# Patient Record
Sex: Female | Born: 1945 | Race: White | Hispanic: No | Marital: Married | Smoking: Never smoker
Health system: Southern US, Community
[De-identification: ages and names within clinical notes are randomized; demographics above are authoritative.]

## PROBLEM LIST (undated history)

## (undated) DIAGNOSIS — Z9889 Other specified postprocedural states: Secondary | ICD-10-CM

## (undated) DIAGNOSIS — E7800 Pure hypercholesterolemia, unspecified: Secondary | ICD-10-CM

## (undated) DIAGNOSIS — R112 Nausea with vomiting, unspecified: Secondary | ICD-10-CM

## (undated) DIAGNOSIS — I10 Essential (primary) hypertension: Secondary | ICD-10-CM

## (undated) HISTORY — PX: OTHER SURGICAL HISTORY: SHX169

## (undated) HISTORY — DX: Pure hypercholesterolemia, unspecified: E78.00

## (undated) HISTORY — PX: NASAL SEPTUM SURGERY: SHX37

## (undated) HISTORY — DX: Essential (primary) hypertension: I10

---

## 2016-06-20 ENCOUNTER — Encounter (INDEPENDENT_AMBULATORY_CARE_PROVIDER_SITE_OTHER): Payer: Self-pay | Admitting: Internal Medicine

## 2016-06-29 ENCOUNTER — Other Ambulatory Visit (INDEPENDENT_AMBULATORY_CARE_PROVIDER_SITE_OTHER): Payer: Self-pay | Admitting: Internal Medicine

## 2016-06-29 ENCOUNTER — Encounter (INDEPENDENT_AMBULATORY_CARE_PROVIDER_SITE_OTHER): Payer: Self-pay | Admitting: Internal Medicine

## 2016-06-29 ENCOUNTER — Encounter (INDEPENDENT_AMBULATORY_CARE_PROVIDER_SITE_OTHER): Payer: Self-pay

## 2016-06-29 ENCOUNTER — Telehealth (INDEPENDENT_AMBULATORY_CARE_PROVIDER_SITE_OTHER): Payer: Self-pay | Admitting: *Deleted

## 2016-06-29 ENCOUNTER — Encounter (INDEPENDENT_AMBULATORY_CARE_PROVIDER_SITE_OTHER): Payer: Self-pay | Admitting: *Deleted

## 2016-06-29 ENCOUNTER — Ambulatory Visit (INDEPENDENT_AMBULATORY_CARE_PROVIDER_SITE_OTHER): Payer: Medicare Other | Admitting: Internal Medicine

## 2016-06-29 VITALS — BP 126/62 | HR 80 | Temp 97.6°F | Ht 64.5 in | Wt 191.9 lb

## 2016-06-29 DIAGNOSIS — I10 Essential (primary) hypertension: Secondary | ICD-10-CM

## 2016-06-29 DIAGNOSIS — E7800 Pure hypercholesterolemia, unspecified: Secondary | ICD-10-CM

## 2016-06-29 DIAGNOSIS — R195 Other fecal abnormalities: Secondary | ICD-10-CM

## 2016-06-29 DIAGNOSIS — K625 Hemorrhage of anus and rectum: Secondary | ICD-10-CM | POA: Insufficient documentation

## 2016-06-29 NOTE — Patient Instructions (Signed)
Colonoscopy.  The risks and benefits such as perforation, bleeding, and infection were reviewed with the patient and is agreeable. 

## 2016-06-29 NOTE — Telephone Encounter (Signed)
Patient needs trilyte 

## 2016-06-29 NOTE — Progress Notes (Signed)
   Subjective:    Patient ID: April Coleman, female    DOB: 1946-06-13, 70 y.o.   MRN: [id number]  HPI Referred by Dr. Trudi Ida. She tells me in the last 5-6 weeks her BMs have changed. Stools are more like peanut butter. She may have to go 2-3 times a day. She says she has lower abdominal pain with her BM. She has seen some blood in her stool yesterday (small amt). She see blood about every 2-3 times a week. Her appetite is good. No weight loss. No dysphagia. Zantac controls her GERD. No melena.  Grandfather had colon cancer age 79.    06/15/2016 H and H 16.5 and 47.1, MCV 90.4, total protein 7.4, Albumin 3.8, total bili 0.9, direct bili 0.2, indirect bili 0.7, AST 45, ALT 70, ALP 72.   01/15/2003 Colonosocpy: Screening Dr. [place] Carbo: Mild internal hemorrhoids, otherwise normal.   Review of Systems Past Medical History:  Diagnosis Date  . High blood pressure   . High cholesterol     Past Surgical History:  Procedure Laterality Date  . breast biopsies     benign  . complete hysterectomy     vaginal bleeding, cramps  . NASAL SEPTUM SURGERY      Allergies  Allergen Reactions  . Codeine     nauseated  . Morphine And Related     nauseated    No current outpatient prescriptions on file prior to visit.   No current facility-administered medications on file prior to visit.    Current Outpatient Prescriptions  Medication Sig Dispense Refill  . atenolol (TENORMIN) 50 MG tablet Take 50 mg by mouth daily.    . cholecalciferol (VITAMIN D) 1000 units tablet Take 1,000 Units by mouth daily.    . hydrochlorothiazide (HYDRODIURIL) 25 MG tablet Take 25 mg by mouth daily.    . montelukast (SINGULAIR) 10 MG tablet Take 10 mg by mouth at bedtime.    . Multiple Vitamin (MULTIVITAMIN) tablet Take 1 tablet by mouth daily.    . naproxen sodium (ANAPROX) 220 MG tablet Take 220 mg by mouth 2 (two) times daily with a meal.    . pantoprazole (PROTONIX) 20 MG tablet Take 20 mg by mouth  daily.    . pravastatin (PRAVACHOL) 40 MG tablet Take 40 mg by mouth daily.    . ranitidine (ZANTAC) 150 MG capsule Take 150 mg by mouth every evening.    . vitamin B-12 (CYANOCOBALAMIN) 1000 MCG tablet Take 1,000 mcg by mouth daily.     No current facility-administered medications for this visit.         Objective:   Physical Exam Blood pressure 126/62, pulse 80, temperature 97.6 F (36.4 C), height 5' 4.5" (1.638 m), weight 191 lb 14.4 oz (87 kg). Alert and oriented. Skin warm and dry. Oral mucosa is moist.   . Sclera anicteric, conjunctivae is pink. Thyroid not enlarged. No cervical lymphadenopathy. Lungs clear. Heart regular rate and rhythm.  Abdomen is soft. Bowel sounds are positive. No hepatomegaly. No abdominal masses felt. No tenderness.  No edema to lower extremities.  Stool brown and guaiac negative.         Assessment & Plan:  Change in stool. Colonic neoplasm needs to be ruled out. Colonoscopy. The risks and benefits such as perforation, bleeding, and infection were reviewed with the patient and is agreeable.

## 2016-07-02 MED ORDER — PEG 3350-KCL-NA BICARB-NACL 420 G PO SOLR: 4000.0000 mL | Freq: Once | ORAL | 0 refills | Status: AC

## 2016-09-12 ENCOUNTER — Encounter (HOSPITAL_COMMUNITY): Payer: Self-pay

## 2016-09-12 ENCOUNTER — Encounter (HOSPITAL_COMMUNITY)
Admission: RE | Disposition: A | Discharge status: Home / Self Care | Payer: Self-pay | Source: Ambulatory Visit | Attending: Internal Medicine

## 2016-09-12 ENCOUNTER — Ambulatory Visit (HOSPITAL_COMMUNITY)
Admission: RE | Admit: 2016-09-12 | Discharge: 2016-09-12 | Disposition: A | Discharge status: Home / Self Care | Payer: Medicare Other | Source: Ambulatory Visit | Attending: Internal Medicine | Admitting: Internal Medicine

## 2016-09-12 DIAGNOSIS — K921 Melena: Secondary | ICD-10-CM | POA: Insufficient documentation

## 2016-09-12 DIAGNOSIS — K644 Residual hemorrhoidal skin tags: Secondary | ICD-10-CM

## 2016-09-12 DIAGNOSIS — Z79899 Other long term (current) drug therapy: Secondary | ICD-10-CM | POA: Insufficient documentation

## 2016-09-12 DIAGNOSIS — K625 Hemorrhage of anus and rectum: Secondary | ICD-10-CM

## 2016-09-12 DIAGNOSIS — R194 Change in bowel habit: Secondary | ICD-10-CM | POA: Insufficient documentation

## 2016-09-12 DIAGNOSIS — K5730 Diverticulosis of large intestine without perforation or abscess without bleeding: Secondary | ICD-10-CM | POA: Insufficient documentation

## 2016-09-12 DIAGNOSIS — D123 Benign neoplasm of transverse colon: Secondary | ICD-10-CM | POA: Diagnosis not present

## 2016-09-12 DIAGNOSIS — R195 Other fecal abnormalities: Secondary | ICD-10-CM

## 2016-09-12 HISTORY — PX: POLYPECTOMY: SHX5525

## 2016-09-12 HISTORY — DX: Nausea with vomiting, unspecified: R11.2

## 2016-09-12 HISTORY — DX: Nausea with vomiting, unspecified: Z98.890

## 2016-09-12 HISTORY — PX: COLONOSCOPY: SHX5424

## 2016-09-12 SURGERY — COLONOSCOPY
Anesthesia: Moderate Sedation

## 2016-09-12 MED ORDER — MEPERIDINE HCL 50 MG/ML IJ SOLN
INTRAMUSCULAR | Status: AC
  Filled 2016-09-12: qty 1

## 2016-09-12 MED ORDER — SODIUM CHLORIDE 0.9 % IV SOLN
INTRAVENOUS | Status: DC
  Administered 2016-09-12: 13:00:00 via INTRAVENOUS

## 2016-09-12 MED ORDER — MIDAZOLAM HCL 5 MG/5ML IJ SOLN
INTRAMUSCULAR | Status: AC
  Filled 2016-09-12: qty 10

## 2016-09-12 MED ORDER — MEPERIDINE HCL 50 MG/ML IJ SOLN
INTRAMUSCULAR | Status: DC | PRN
  Administered 2016-09-12 (×2): 25 mg via INTRAVENOUS

## 2016-09-12 MED ORDER — MIDAZOLAM HCL 5 MG/5ML IJ SOLN
INTRAMUSCULAR | Status: DC | PRN
  Administered 2016-09-12: 2 mg via INTRAVENOUS
  Administered 2016-09-12: 1 mg via INTRAVENOUS
  Administered 2016-09-12: 2 mg via INTRAVENOUS
  Administered 2016-09-12: 1 mg via INTRAVENOUS
  Administered 2016-09-12: 2 mg via INTRAVENOUS

## 2016-09-12 MED ORDER — SIMETHICONE 40 MG/0.6ML PO SUSP
ORAL | Status: DC | PRN
  Administered 2016-09-12: 14:00:00

## 2016-09-12 MED ORDER — PSYLLIUM 28 % PO PACK: 1.0000 | PACK | Freq: Every day | ORAL | Status: AC

## 2016-09-12 NOTE — H&P (Signed)
April Coleman is an 71 y.o. female.   Chief Complaint: Patient is here for colonoscopy. HPI: Patient is 70 year old Caucasian female whose last colonoscopy was 13 years ago who presents with 4 month history of change in her bowel habits. She has noted increased frequency and stool is not formed anymore. She's had a few episodes of hematochezia felt to be secondary to hemorrhoids. She has good appetite. She has not changed her medications recently. Family history is negative for CRC.  Past Medical History:  Diagnosis Date  . High blood pressure   . High cholesterol   . PONV (postoperative nausea and vomiting)     Past Surgical History:  Procedure Laterality Date  . breast biopsies     benign  . complete hysterectomy     vaginal bleeding, cramps  . NASAL SEPTUM SURGERY      Family History  Problem Relation Age of Onset  . Colon cancer Neg Hx    Social History:  reports that she has never smoked. She has never used smokeless tobacco. She reports that she drinks alcohol. She reports that she does not use drugs.  Allergies:  Allergies  Allergen Reactions  . Codeine     nauseated  . Morphine And Related     nauseated    Medications Prior to Admission  Medication Sig Dispense Refill  . atenolol (TENORMIN) 50 MG tablet Take 50 mg by mouth daily.    . cholecalciferol (VITAMIN D) 1000 units tablet Take 1,000 Units by mouth daily.    . hydrochlorothiazide (HYDRODIURIL) 25 MG tablet Take 25 mg by mouth daily.    . montelukast (SINGULAIR) 10 MG tablet Take 10 mg by mouth at bedtime.    . Multiple Vitamin (MULTIVITAMIN) tablet Take 1 tablet by mouth daily.    . naproxen sodium (ANAPROX) 220 MG tablet Take 220 mg by mouth daily as needed (pain).     . pantoprazole (PROTONIX) 20 MG tablet Take 20 mg by mouth daily.    . pravastatin (PRAVACHOL) 40 MG tablet Take 40 mg by mouth daily.    . ranitidine (ZANTAC) 150 MG capsule Take 150 mg by mouth every evening.    . vitamin B-12  (CYANOCOBALAMIN) 1000 MCG tablet Take 1,000 mcg by mouth daily.      No results found for this or any previous visit (from the past 48 hour(s)). No results found.  ROS  Blood pressure (!) 144/83, pulse 74, temperature 98.7 F (37.1 C), temperature source Oral, resp. rate 18, height 5\' 4"  (1.626 m), weight 185 lb (83.9 kg), SpO2 96 %. Physical Exam  Constitutional: She appears well-developed and well-nourished.  HENT:  Mouth/Throat: Oropharynx is clear and moist.  Eyes: Conjunctivae are normal. No scleral icterus.  Neck: No thyromegaly present.  Cardiovascular: Normal rate and regular rhythm.   Murmur heard. Faint SEM at upper LSB  Respiratory: Effort normal and breath sounds normal.  GI: Soft. She exhibits no distension and no mass.  Musculoskeletal: She exhibits no edema.  Lymphadenopathy:    She has no cervical adenopathy.  Neurological: She is alert.  Skin: Skin is warm and dry.     Assessment/Plan Change in bowel habits and hematochezia. Diagnostic colonoscopy.  Hildred Laser, MD 09/12/2016, 2:09 PM

## 2016-09-12 NOTE — Op Note (Signed)
[facility] Patient Name: April Coleman Procedure Date: 09/12/2016 1:50 PM MRN: [id number] Date of Birth: 1946-02-05 Attending MD: Hildred Laser , MD CSN: [id number] Age: 70 Admit Type: Outpatient Procedure:                Colonoscopy Indications:              Hematochezia, Change in bowel habits Providers:                Hildred Laser, MD, Otis Peak B. Sharon Seller, RN, Randa Spike, Technician Referring MD:             Rachel Bo. Harris, MD Medicines:                Meperidine 50 mg IV, Midazolam 8 mg IV Complications:            No immediate complications. Estimated Blood Loss:     Estimated blood loss: none. Estimated blood loss                            was minimal. Procedure:                Pre-Anesthesia Assessment:                           - Prior to the procedure, a History and Physical                            was performed, and patient medications and                            allergies were reviewed. The patient's tolerance of                            previous anesthesia was also reviewed. The risks                            and benefits of the procedure and the sedation                            options and risks were discussed with the patient.                            All questions were answered, and informed consent                            was obtained. Prior Anticoagulants: The patient                            last took naproxen 7 days prior to the procedure.                            ASA Grade Assessment: II - A patient with mild  systemic disease. After reviewing the risks and                            benefits, the patient was deemed in satisfactory                            condition to undergo the procedure.                           After obtaining informed consent, the colonoscope                            was passed under direct vision. Throughout the                             procedure, the patient's blood pressure, pulse, and                            oxygen saturations were monitored continuously. The                            [id number]) scope was introduced through                            the anus and advanced to the the cecum, identified                            by appendiceal orifice and ileocecal valve. The                            colonoscopy was performed without difficulty. The                            patient tolerated the procedure well. The quality                            of the bowel preparation was adequate. The                            ileocecal valve, appendiceal orifice, and rectum                            were photographed. Scope In: 2:19:32 PM Scope Out: 2:38:22 PM Scope Withdrawal Time: 0 hours 11 minutes 40 seconds  Total Procedure Duration: 0 hours 18 minutes 50 seconds  Findings:      Two sessile polyps were found in the proximal transverse colon. The       polyps were small in size. These were biopsied with a cold forceps for       histology. The pathology specimen was placed into Bottle Number 1.      A few small-mouthed diverticula were found in the sigmoid colon. Single       diverticulum at cecum.      External hemorrhoids were found during retroflexion. The hemorrhoids       were small. Impression:               -  Two small polyps in the proximal transverse                            colon. Biopsied.                           - Diverticulosis in the sigmoid colon alongwith one                            diverticulum at cecum.                           - External hemorrhoids. Moderate Sedation:      Moderate (conscious) sedation was administered by the endoscopy nurse       and supervised by the endoscopist. The following parameters were       monitored: oxygen saturation, heart rate, blood pressure, CO2       capnography and response to care. Total physician intraservice time was       24  minutes. Recommendation:           - Patient has a contact number available for                            emergencies. The signs and symptoms of potential                            delayed complications were discussed with the                            patient. Return to normal activities tomorrow.                            Written discharge instructions were provided to the                            patient.                           - High fiber diet today.                           - Continue present medications.                           - Use original regular Metamucil one tablespoon PO                            daily.                           - Await pathology results.                           - Repeat colonoscopy for surveillance based on                            pathology results. Procedure Code(s):        ---  Professional ---                           [phone], Colonoscopy, flexible; with biopsy, single                            or multiple                           99152, Moderate sedation services provided by the                            same physician or other qualified health care                            professional performing the diagnostic or                            therapeutic service that the sedation supports,                            requiring the presence of an independent trained                            observer to assist in the monitoring of the                            patient's level of consciousness and physiological                            status; initial 15 minutes of intraservice time,                            patient age 55 years or older                           [phone], Moderate sedation services; each additional                            15 minutes intraservice time Diagnosis Code(s):        --- Professional ---                           D12.3, Benign neoplasm of transverse colon (hepatic                            flexure or  splenic flexure)                           K64.4, Residual hemorrhoidal skin tags                           K92.1, Melena (includes Hematochezia)                           R19.4, Change in bowel habit  K57.30, Diverticulosis of large intestine without                            perforation or abscess without bleeding CPT copyright 2016 American Medical Association. All rights reserved. The codes documented in this report are preliminary and upon coder review may  be revised to meet current compliance requirements. Hildred Laser, MD Hildred Laser, MD 09/12/2016 2:48:35 PM This report has been signed electronically. Number of Addenda: 0

## 2016-09-12 NOTE — Discharge Instructions (Signed)
Resume usual medications and high fiber diet. Metamucil one heaping tablespoon full or 4 g by mouth daily at bedtime. No driving for 24 hours. Physician will call with biopsy results.    Colonoscopy, Adult, Care After This sheet gives you information about how to care for yourself after your procedure. Your health care provider may also give you more specific instructions. If you have problems or questions, contact your health care provider. What can I expect after the procedure? After the procedure, it is common to have:  A small amount of blood in your stool for 24 hours after the procedure.  Some gas.  Mild abdominal cramping or bloating. Follow these instructions at home: General instructions  For the first 24 hours after the procedure:  Do not drive or use machinery.  Do not sign important documents.  Do not drink alcohol.  Do your regular daily activities at a slower pace than normal.  Eat soft, easy-to-digest foods.  Rest often.  Take over-the-counter or prescription medicines only as told by your health care provider.  It is up to you to get the results of your procedure. Ask your health care provider, or the department performing the procedure, when your results will be ready. Relieving cramping and bloating  Try walking around when you have cramps or feel bloated.  Apply heat to your abdomen as told by your health care provider. Use a heat source that your health care provider recommends, such as a moist heat pack or a heating pad.  Place a towel between your skin and the heat source.  Leave the heat on for 20-30 minutes.  Remove the heat if your skin turns bright red. This is especially important if you are unable to feel pain, heat, or cold. You may have a greater risk of getting burned. Eating and drinking  Drink enough fluid to keep your urine clear or pale yellow.  Resume your normal diet as instructed by your health care provider. Avoid heavy or  fried foods that are hard to digest.  Avoid drinking alcohol for as long as instructed by your health care provider. Contact a health care provider if:  You have blood in your stool 2-3 days after the procedure. Get help right away if:  You have more than a small spotting of blood in your stool.  You pass large blood clots in your stool.  Your abdomen is swollen.  You have nausea or vomiting.  You have a fever.  You have increasing abdominal pain that is not relieved with medicine. This information is not intended to replace advice given to you by your health care provider. Make sure you discuss any questions you have with your health care provider. Document Released: 05/08/2004 Document Revised: 06/18/2016 Document Reviewed: 12/06/2015 Elsevier Interactive Patient Education  2017 [employer].   Colon Polyps Introduction Polyps are tissue growths inside the body. Polyps can grow in many places, including the large intestine (colon). A polyp may be a round bump or a mushroom-shaped growth. You could have one polyp or several. Most colon polyps are noncancerous (benign). However, some colon polyps can become cancerous over time. What are the causes? The exact cause of colon polyps is not known. What increases the risk? This condition is more likely to develop in people who:  Have a family history of colon cancer or colon polyps.  Are older than 24 or older than 45 if they are African American.  Have inflammatory bowel disease, such as ulcerative colitis or  Crohn disease.  Are overweight.  Smoke cigarettes.  Do not get enough exercise.  Drink too much alcohol.  Eat a diet that is:  High in fat and red meat.  Low in fiber.  Had childhood cancer that was treated with abdominal radiation. What are the signs or symptoms? Most polyps do not cause symptoms. If you have symptoms, they may include:  Blood coming from your rectum when having a bowel movement.  Blood  in your stool.The stool may look dark red or black.  A change in bowel habits, such as constipation or diarrhea. How is this diagnosed? This condition is diagnosed with a colonoscopy. This is a procedure that uses a lighted, flexible scope to look at the inside of your colon. How is this treated? Treatment for this condition involves removing any polyps that are found. Those polyps will then be tested for cancer. If cancer is found, your health care provider will talk to you about options for colon cancer treatment. Follow these instructions at home: Diet  Eat plenty of fiber, such as fruits, vegetables, and whole grains.  Eat foods that are high in calcium and vitamin D, such as milk, cheese, yogurt, eggs, liver, fish, and broccoli.  Limit foods high in fat, red meats, and processed meats, such as hot dogs, sausage, bacon, and lunch meats.  Maintain a healthy weight, or lose weight if recommended by your health care provider. General instructions  Do not smoke cigarettes.  Do not drink alcohol excessively.  Keep all follow-up visits as told by your health care provider. This is important. This includes keeping regularly scheduled colonoscopies. Talk to your health care provider about when you need a colonoscopy.  Exercise every day or as told by your health care provider. Contact a health care provider if:  You have new or worsening bleeding during a bowel movement.  You have new or increased blood in your stool.  You have a change in bowel habits.  You unexpectedly lose weight. This information is not intended to replace advice given to you by your health care provider. Make sure you discuss any questions you have with your health care provider. Document Released: 06/20/2004 Document Revised: 03/01/2016 Document Reviewed: 08/15/2015  2017 Elsevier    Diverticulosis Diverticulosis is the condition that develops when small pouches (diverticula) form in the wall of your  colon. Your colon, or large intestine, is where water is absorbed and stool is formed. The pouches form when the inside layer of your colon pushes through weak spots in the outer layers of your colon. CAUSES  No one knows exactly what causes diverticulosis. RISK FACTORS  Being older than 40. Your risk for this condition increases with age. Diverticulosis is rare in people younger than 40 years. By age 67, almost everyone has it.  Eating a low-fiber diet.  Being frequently constipated.  Being overweight.  Not getting enough exercise.  Smoking.  Taking over-the-counter pain medicines, like aspirin and ibuprofen. SYMPTOMS  Most people with diverticulosis do not have symptoms. DIAGNOSIS  Because diverticulosis often has no symptoms, health care providers often discover the condition during an exam for other colon problems. In many cases, a health care provider will diagnose diverticulosis while using a flexible scope to examine the colon (colonoscopy). TREATMENT  If you have never developed an infection related to diverticulosis, you may not need treatment. If you have had an infection before, treatment may include:  Eating more fruits, vegetables, and grains.  Taking a fiber supplement.  Taking a live bacteria supplement (probiotic).  Taking medicine to relax your colon. HOME CARE INSTRUCTIONS   Drink at least 6-8 glasses of water each day to prevent constipation.  Try not to strain when you have a bowel movement.  Keep all follow-up appointments. If you have had an infection before:  Increase the fiber in your diet as directed by your health care provider or dietitian.  Take a dietary fiber supplement if your health care provider approves.  Only take medicines as directed by your health care provider. SEEK MEDICAL CARE IF:   You have abdominal pain.  You have bloating.  You have cramps.  You have not gone to the bathroom in 3 days. SEEK IMMEDIATE MEDICAL CARE IF:    Your pain gets worse.  Yourbloating becomes very bad.  You have a fever or chills, and your symptoms suddenly get worse.  You begin vomiting.  You have bowel movements that are bloody or black. MAKE SURE YOU:  Understand these instructions.  Will watch your condition.  Will get help right away if you are not doing well or get worse. This information is not intended to replace advice given to you by your health care provider. Make sure you discuss any questions you have with your health care provider. Document Released: 06/21/2004 Document Revised: 09/29/2013 Document Reviewed: 08/19/2013 Elsevier Interactive Patient Education  2017 [employer].

## 2016-09-14 ENCOUNTER — Encounter (HOSPITAL_COMMUNITY): Payer: Self-pay | Admitting: Internal Medicine

## 2017-10-16 ENCOUNTER — Encounter (INDEPENDENT_AMBULATORY_CARE_PROVIDER_SITE_OTHER): Payer: Self-pay | Admitting: Internal Medicine

## 2017-10-16 ENCOUNTER — Telehealth (INDEPENDENT_AMBULATORY_CARE_PROVIDER_SITE_OTHER): Payer: Self-pay | Admitting: Internal Medicine

## 2017-10-16 ENCOUNTER — Encounter (INDEPENDENT_AMBULATORY_CARE_PROVIDER_SITE_OTHER): Payer: Self-pay

## 2017-10-16 ENCOUNTER — Ambulatory Visit (INDEPENDENT_AMBULATORY_CARE_PROVIDER_SITE_OTHER): Payer: Medicare Other | Admitting: Internal Medicine

## 2017-10-16 VITALS — BP 138/70 | HR 72 | Temp 98.3°F | Ht 64.5 in | Wt 190.9 lb

## 2017-10-16 DIAGNOSIS — R740 Nonspecific elevation of levels of transaminase and lactic acid dehydrogenase [LDH]: Secondary | ICD-10-CM

## 2017-10-16 DIAGNOSIS — R160 Hepatomegaly, not elsewhere classified: Secondary | ICD-10-CM | POA: Diagnosis not present

## 2017-10-16 DIAGNOSIS — R748 Abnormal levels of other serum enzymes: Secondary | ICD-10-CM

## 2017-10-16 DIAGNOSIS — R7401 Elevation of levels of liver transaminase levels: Secondary | ICD-10-CM

## 2017-10-16 NOTE — Telephone Encounter (Signed)
April Coleman, Needs US abdomen

## 2017-10-16 NOTE — Progress Notes (Addendum)
   Subjective:    Patient ID: April Coleman, female    DOB: 10-21-1945, 72 y.o.   MRN: [id number]  HPI Referred by Trudi Ida MD for elevated liver enzymes.Appears liver enzymes have been elevated in the past. She states they have been elevated at least 2 years. She also states she had an US abdomen. (Will get that report.) 09/25/2017  Total bili 0.3, AST 53, ALT 79, ALP 92 H nad H 15.4 and 45.8, platelet ct 174 06/14/2016 ALT 70 , ALP 72, AST 45.  10/11/2017 US pelvis: transvaginal:  Patient is post hysterectomy and oophorectomy. No pelvic masses present.   Underwent a colonoscopy in 2017 for change in stools. Two small polyps.  Diverticulosis. Both tubular adenoma.    Review of Systems Past Medical History:  Diagnosis Date  . High blood pressure   . High cholesterol   . PONV (postoperative nausea and vomiting)     Past Surgical History:  Procedure Laterality Date  . breast biopsies     benign  . COLONOSCOPY N/A 09/12/2016   Procedure: COLONOSCOPY;  Surgeon: Rogene Houston, MD;  Location: [facility];  Service: Endoscopy;  Laterality: N/A;  2:00  . complete hysterectomy     vaginal bleeding, cramps  . NASAL SEPTUM SURGERY    . POLYPECTOMY  09/12/2016   Procedure: POLYPECTOMY;  Surgeon: Rogene Houston, MD;  Location: [facility];  Service: Endoscopy;;  colon    Allergies  Allergen Reactions  . Codeine     nauseated  . Morphine And Related     nauseated    Current Outpatient Medications on File Prior to Visit  Medication Sig Dispense Refill  . amLODipine (NORVASC) 5 MG tablet Take 5 mg by mouth daily.    Marland Kitchen atenolol (TENORMIN) 50 MG tablet Take 50 mg by mouth daily.    . cholecalciferol (VITAMIN D) 1000 units tablet Take 2,000 Units by mouth daily.     . montelukast (SINGULAIR) 10 MG tablet Take 10 mg by mouth at bedtime.    . Multiple Vitamin (MULTIVITAMIN) tablet Take 1 tablet by mouth daily.    . naproxen sodium (ANAPROX) 220 MG tablet Take 220 mg by mouth daily  as needed (pain).     . pantoprazole (PROTONIX) 20 MG tablet Take 20 mg by mouth daily.    . pravastatin (PRAVACHOL) 40 MG tablet Take 40 mg by mouth daily.    . psyllium (METAMUCIL SMOOTH TEXTURE) 28 % packet Take 1 packet by mouth at bedtime. (Patient taking differently: Take 1 packet by mouth as needed. )    . ranitidine (ZANTAC) 150 MG capsule Take 150 mg by mouth every evening.     No current facility-administered medications on file prior to visit.         Objective:   Physical Exam Blood pressure 138/70, pulse 72, temperature 98.3 F (36.8 C), height 5' 4.5" (1.638 m), weight 190 lb 14.4 oz (86.6 kg).  Alert and oriented. Skin warm and dry. Oral mucosa is moist.   . Sclera anicteric, conjunctivae is pink. Thyroid not enlarged. No cervical lymphadenopathy. Lungs clear. Heart regular rate and rhythm.  Abdomen is soft. Bowel sounds are positive. No hepatomegaly. No abdominal masses felt. No tenderness.  No edema to lower extremities.         Assessment & Plan:  Elevated liver enzymes, Hepatic function , Ceruloplasmin, alpha 1, PT/INR, sedrate, IGG, SMA, ANA.  Labs and US abdomen from her PCP.

## 2017-10-16 NOTE — Telephone Encounter (Signed)
[place] needs to be done at [facility].

## 2017-10-16 NOTE — Patient Instructions (Signed)
OV in 6 months. 

## 2017-10-17 NOTE — Telephone Encounter (Signed)
[place] sch'd 10/22/17 at [phone] @ [facility], patient aware

## 2017-10-20 LAB — HEPATIC FUNCTION PANEL
AG Ratio: 1.4 (calc) (ref 1.0–2.5)
ALBUMIN MSPROF: 4.4 g/dL (ref 3.6–5.1)
ALKALINE PHOSPHATASE (APISO): 76 U/L (ref 33–130)
ALT: 68 U/L — AB (ref 6–29)
AST: 52 U/L — AB (ref 10–35)
Bilirubin, Direct: 0.2 mg/dL (ref 0.0–0.2)
Globulin: 3.2 g/dL (calc) (ref 1.9–3.7)
Indirect Bilirubin: 0.8 mg/dL (calc) (ref 0.2–1.2)
TOTAL PROTEIN: 7.6 g/dL (ref 6.1–8.1)
Total Bilirubin: 1 mg/dL (ref 0.2–1.2)

## 2017-10-20 LAB — ANTI-NUCLEAR AB-TITER (ANA TITER): ANA Titer 1: 1:80 {titer} — ABNORMAL HIGH

## 2017-10-20 LAB — PROTIME-INR
INR: 1.1
Prothrombin Time: 11.7 s — ABNORMAL HIGH (ref 9.0–11.5)

## 2017-10-20 LAB — CERULOPLASMIN: Ceruloplasmin: 35 mg/dL (ref 18–53)

## 2017-10-20 LAB — FERRITIN: Ferritin: 259 ng/mL (ref 20–288)

## 2017-10-20 LAB — SEDIMENTATION RATE: SED RATE: 14 mm/h (ref 0–30)

## 2017-10-20 LAB — ALPHA-1-ANTITRYPSIN: A-1 Antitrypsin, Ser: 171 mg/dL (ref 83–199)

## 2017-10-20 LAB — ANTI-SMOOTH MUSCLE ANTIBODY, IGG: Actin (Smooth Muscle) Antibody (IGG): <20 U (ref <20)

## 2017-10-20 LAB — ANA: Anti Nuclear Antibody(ANA): POSITIVE — AB

## 2017-10-22 ENCOUNTER — Ambulatory Visit (HOSPITAL_COMMUNITY)
Admission: RE | Admit: 2017-10-22 | Discharge: 2017-10-22 | Disposition: A | Discharge status: Home / Self Care | Payer: Medicare Other | Source: Ambulatory Visit | Attending: Internal Medicine | Admitting: Internal Medicine

## 2017-10-22 DIAGNOSIS — K8020 Calculus of gallbladder without cholecystitis without obstruction: Secondary | ICD-10-CM | POA: Insufficient documentation

## 2017-10-22 DIAGNOSIS — R748 Abnormal levels of other serum enzymes: Secondary | ICD-10-CM | POA: Diagnosis present

## 2017-10-23 ENCOUNTER — Other Ambulatory Visit (INDEPENDENT_AMBULATORY_CARE_PROVIDER_SITE_OTHER): Payer: Self-pay | Admitting: *Deleted

## 2017-10-23 DIAGNOSIS — R748 Abnormal levels of other serum enzymes: Secondary | ICD-10-CM

## 2017-10-25 NOTE — Progress Notes (Signed)
Patient already has an appointment scheduled for 04/15/18 at 2:15pm with Deberah Castle, NP.

## 2017-10-28 ENCOUNTER — Telehealth (INDEPENDENT_AMBULATORY_CARE_PROVIDER_SITE_OTHER): Payer: Self-pay | Admitting: *Deleted

## 2017-10-28 NOTE — Telephone Encounter (Signed)
She has started the Hep A and B vaccine.

## 2017-10-28 NOTE — Telephone Encounter (Signed)
Patient called in stating she was able to get the shots you had asked her to get at [facility].  You were going to give her an RX for this --she does not need it now

## 2018-04-15 ENCOUNTER — Ambulatory Visit (INDEPENDENT_AMBULATORY_CARE_PROVIDER_SITE_OTHER): Payer: Medicare Other | Admitting: Internal Medicine

## 2018-04-15 ENCOUNTER — Encounter (INDEPENDENT_AMBULATORY_CARE_PROVIDER_SITE_OTHER): Payer: Self-pay | Admitting: Internal Medicine

## 2018-04-15 VITALS — BP 142/64 | HR 64 | Temp 97.6°F | Ht 64.5 in | Wt 174.2 lb

## 2018-04-15 DIAGNOSIS — K760 Fatty (change of) liver, not elsewhere classified: Secondary | ICD-10-CM | POA: Diagnosis not present

## 2018-04-15 DIAGNOSIS — R748 Abnormal levels of other serum enzymes: Secondary | ICD-10-CM

## 2018-04-15 NOTE — Patient Instructions (Addendum)
OV in 6 months. 

## 2018-04-15 NOTE — Progress Notes (Addendum)
Subjective:    Patient ID: April Coleman, female    DOB: [birth year], 72 y.o.   MRN: [id number] PCP Dr. Shearon Stalls.  HPI Here today for f/u. Seen as a new patient in January of this year. Hx of elevated liver enzymes for at least 2 yrs. (Scanned document September 2017).  She tells me she is doing good.  She has lost 16 pounds since her visit in January which was intentional.  Her appetite is good. BMs are normal.  Labs in January revealed positive ANA with normal sedrate. Rest of her labs were normal.   Underwent a colonoscopy in 2017 for change in stools. Two small polyps.  Diverticulosis. Both tubular adenoma.   10/22/2017 US abdomen complete:  IMPRESSION: Gallstones without sonographic evidence of acute cholecystitis. Minimal dilation of the common bile duct without evidence of intraluminal stones or sludge.  Increased hepatic echotexture most compatible with fatty  infiltrative change.   Has started the Hep A and B vaccine.  03/20/2017 HCT quait not detected. Hepatitis B surface antigen non-reactive. 10/16/2017 ANA positive, ferritin 259, SMA less than 20, alpha 1 antitrypsin 171 (Normal). Sedrate 14.  INR 1.1, PT 11.7.  Ceruloplasmin 35, ANA pattern speckled, ANA titer 1.80  Hepatic Function Latest Ref Rng & Units 10/16/2017  Total Protein 6.1 - 8.1 g/dL 7.6  AST 10 - 35 U/L 52(H)  ALT 6 - 29 U/L 68(H)  Total Bilirubin 0.2 - 1.2 mg/dL 1.0  Bilirubin, Direct 0.0 - 0.2 mg/dL 0.2       Review of Systems Past Medical History:  Diagnosis Date  . High blood pressure   . High cholesterol   . PONV (postoperative nausea and vomiting)     Past Surgical History:  Procedure Laterality Date  . breast biopsies     benign  . COLONOSCOPY N/A 09/12/2016   Procedure: COLONOSCOPY;  Surgeon: Rogene Houston, MD;  Location: [facility];  Service: Endoscopy;  Laterality: N/A;  2:00  . complete hysterectomy     vaginal bleeding, cramps  . NASAL SEPTUM SURGERY    . POLYPECTOMY   09/12/2016   Procedure: POLYPECTOMY;  Surgeon: Rogene Houston, MD;  Location: [facility];  Service: Endoscopy;;  colon    Allergies  Allergen Reactions  . Codeine     nauseated  . Morphine And Related     nauseated    Current Outpatient Medications on File Prior to Visit  Medication Sig Dispense Refill  . amLODipine (NORVASC) 5 MG tablet Take 5 mg by mouth daily.    Marland Kitchen atenolol (TENORMIN) 50 MG tablet Take 50 mg by mouth daily.    . cholecalciferol (VITAMIN D) 1000 units tablet Take 2,000 Units by mouth daily.     . montelukast (SINGULAIR) 10 MG tablet Take 10 mg by mouth at bedtime.    . Multiple Vitamin (MULTIVITAMIN) tablet Take 1 tablet by mouth daily.    . naproxen sodium (ANAPROX) 220 MG tablet Take 220 mg by mouth daily as needed (pain).     . pantoprazole (PROTONIX) 20 MG tablet Take 20 mg by mouth daily.    . pravastatin (PRAVACHOL) 40 MG tablet Take 40 mg by mouth daily.    . psyllium (METAMUCIL SMOOTH TEXTURE) 28 % packet Take 1 packet by mouth at bedtime. (Patient taking differently: Take 1 packet by mouth as needed. )    . ranitidine (ZANTAC) 150 MG capsule Take 150 mg by mouth every evening.     No current facility-administered  medications on file prior to visit.         Objective:   Physical Exam Blood pressure (!) 142/64, pulse 64, temperature 97.6 F (36.4 C), height 5' 4.5" (1.638 m), weight 174 lb 3.2 oz (79 kg). Alert and oriented. Skin warm and dry. Oral mucosa is moist.   . Sclera anicteric, conjunctivae is pink. Thyroid not enlarged. No cervical lymphadenopathy. Lungs clear. Heart regular rate and rhythm.  Abdomen is soft. Bowel sounds are positive. No hepatomegaly. No abdominal masses felt. No tenderness.  No edema to lower extremities.  Appears to have fungal infection under all toenails and finger nails. .         Assessment & Plan:  Elevated liver enzymes. Am going to  Hepatic and repeat ANA. Will get acute hepatitis panel from her PCP.  OV in 6  months.

## 2018-04-17 LAB — HEPATIC FUNCTION PANEL
AG RATIO: 1.6 (calc) (ref 1.0–2.5)
ALT: 26 U/L (ref 6–29)
AST: 20 U/L (ref 10–35)
Albumin: 4.7 g/dL (ref 3.6–5.1)
Alkaline phosphatase (APISO): 85 U/L (ref 33–130)
BILIRUBIN DIRECT: 0.2 mg/dL (ref 0.0–0.2)
GLOBULIN: 2.9 g/dL (ref 1.9–3.7)
Indirect Bilirubin: 0.8 mg/dL (calc) (ref 0.2–1.2)
Total Bilirubin: 1 mg/dL (ref 0.2–1.2)
Total Protein: 7.6 g/dL (ref 6.1–8.1)

## 2018-04-17 LAB — ANTI-NUCLEAR AB-TITER (ANA TITER)

## 2018-04-17 LAB — ANA: Anti Nuclear Antibody(ANA): POSITIVE — AB

## 2018-10-21 ENCOUNTER — Ambulatory Visit (INDEPENDENT_AMBULATORY_CARE_PROVIDER_SITE_OTHER): Payer: Medicare Other | Admitting: Internal Medicine

## 2018-11-25 ENCOUNTER — Other Ambulatory Visit: Payer: Self-pay | Admitting: Dermatology

## 2018-12-08 IMAGING — US US ABDOMEN COMPLETE
1 series · 13 of 25 positions shown · non-contrast
Comparison: None in PACs

CLINICAL DATA: Elevated liver function studies found 2 weeks ago.

EXAM:
ABDOMEN ULTRASOUND COMPLETE

[Series 1: us abdomen complete · 0.22mm/px · 13 of 107 slices shown]
[im 1/107]
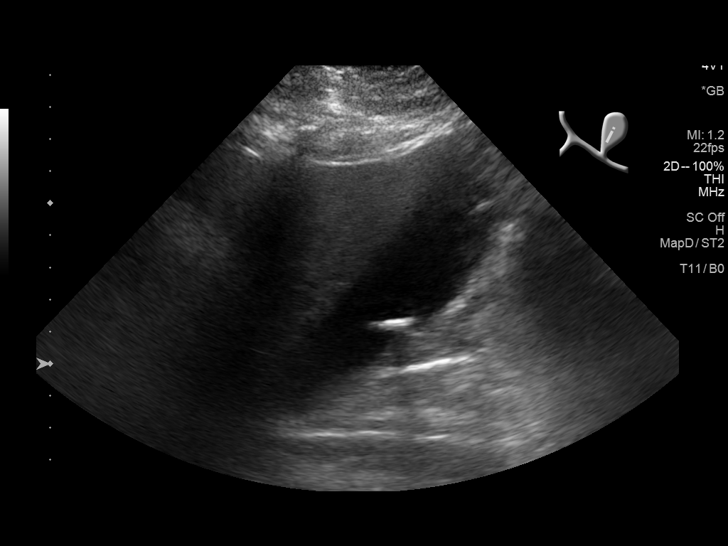
[im 9/107]
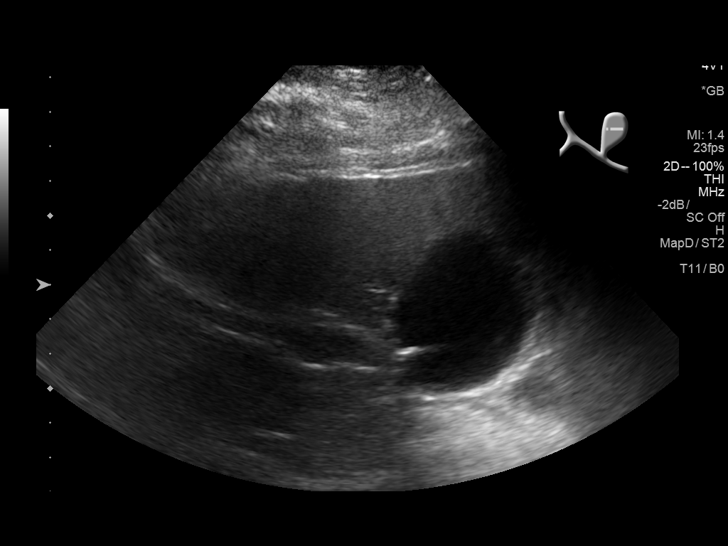
[im 18/107]
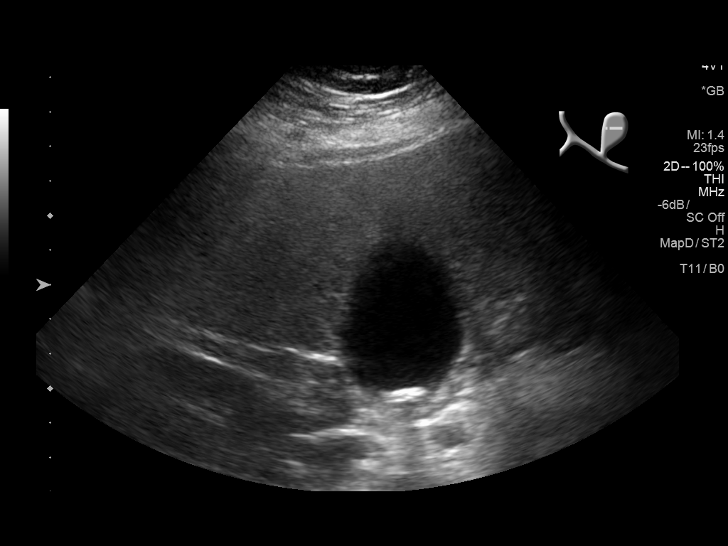
[im 27/107]
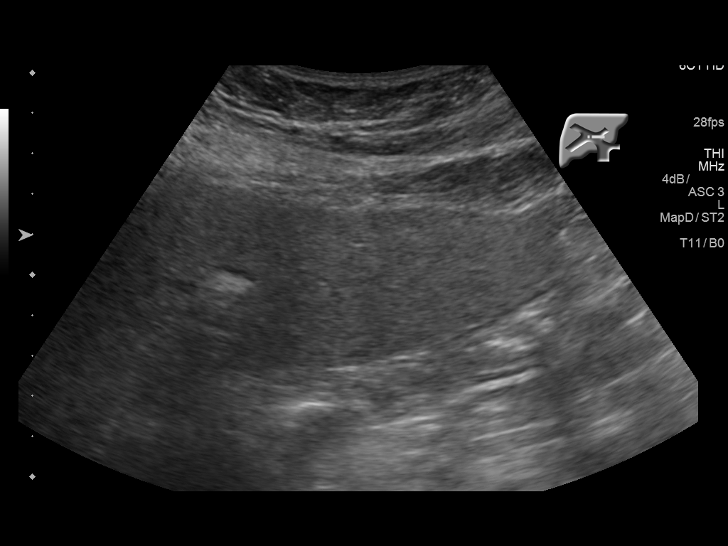
[im 36/107]
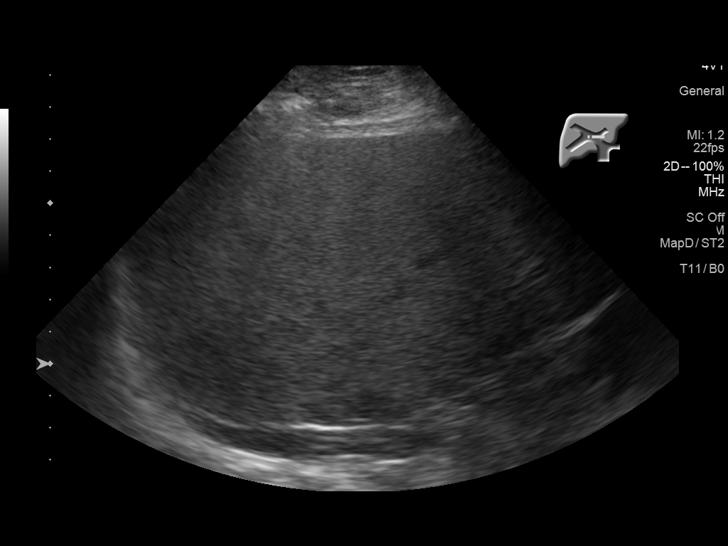
[im 45/107]
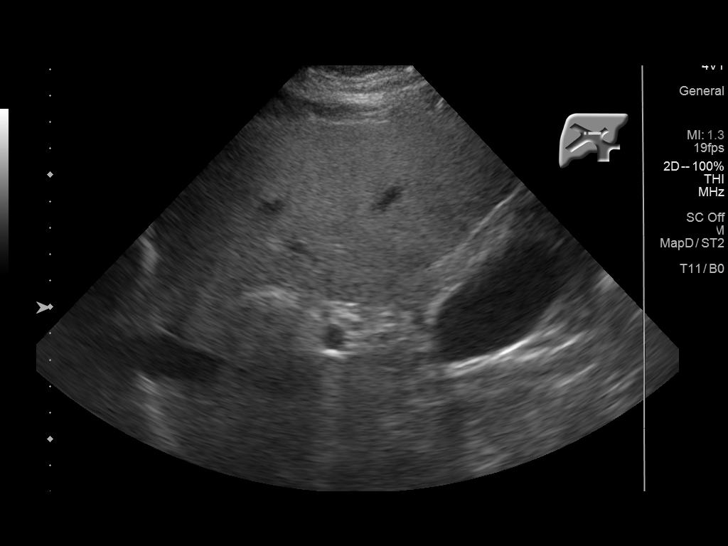
[im 54/107]
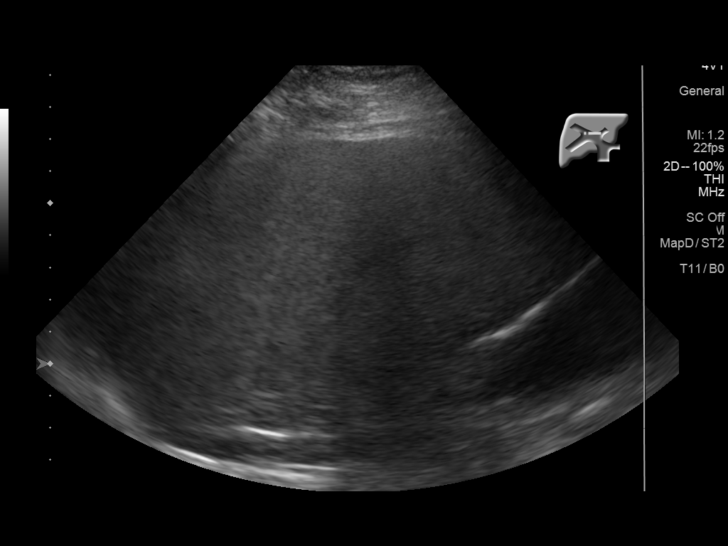
[im 62/107]
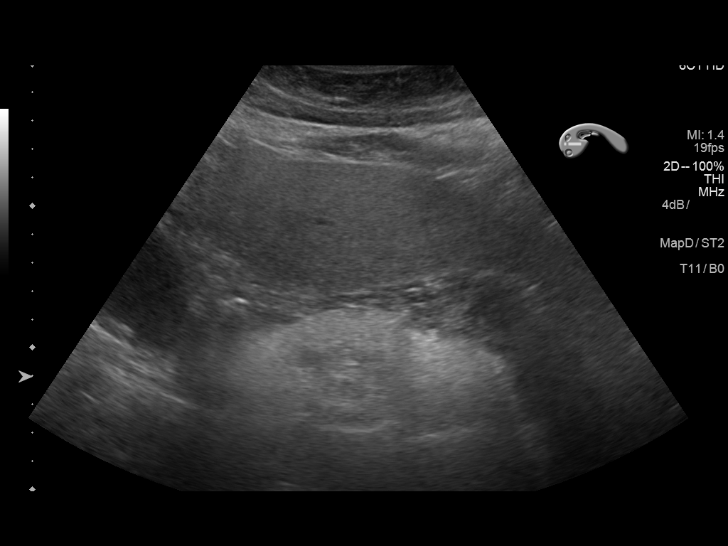
[im 71/107]
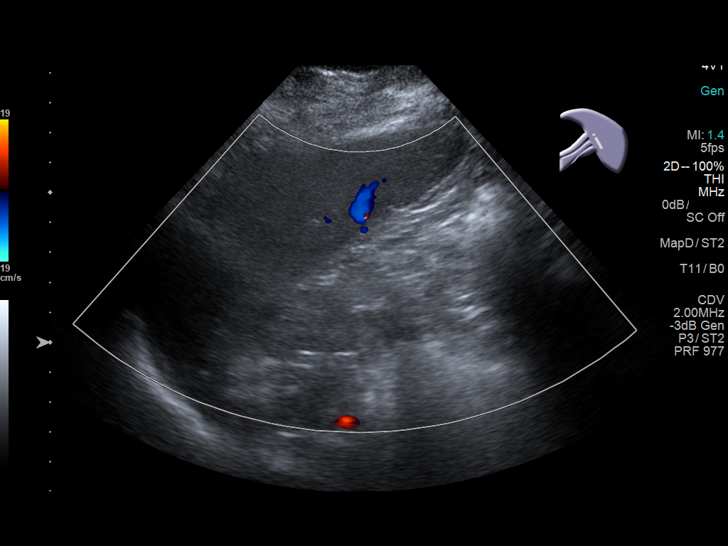
[im 80/107]
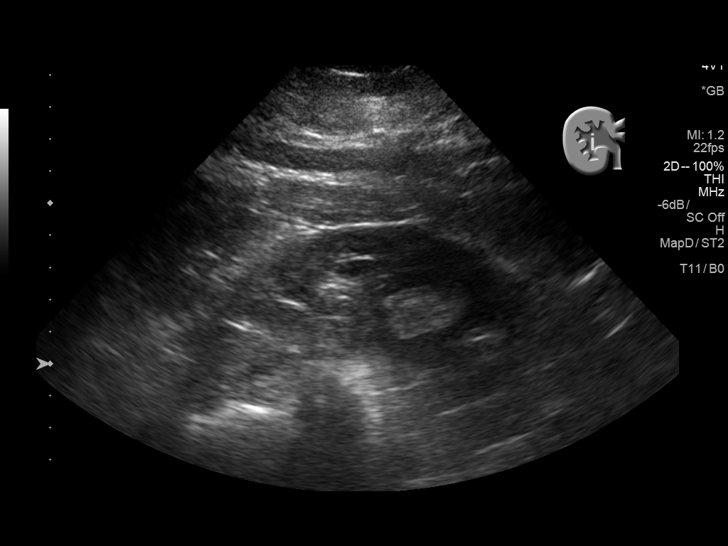
[im 89/107]
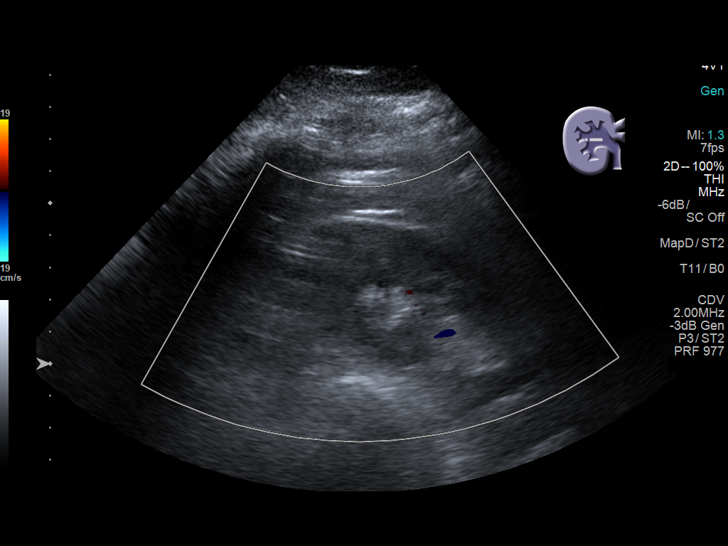
[im 98/107]
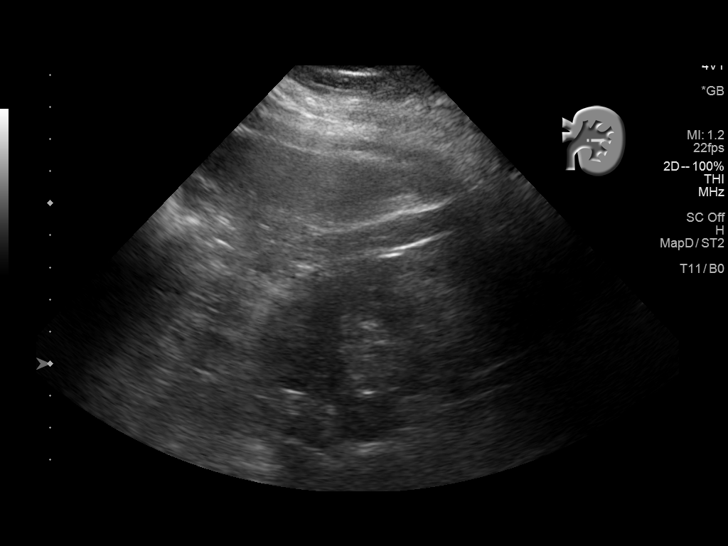
[im 107/107]
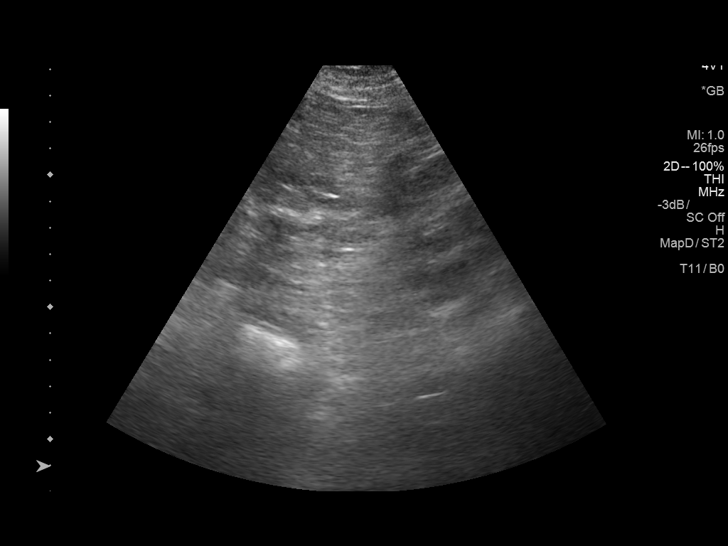

[13 of 25 positions shown; findings below may reference images not displayed]

FINDINGS: Gallbladder: The gallbladder is adequately distended. There is an
echogenic mobile stone measuring 13 mm in greatest dimension. There
is no gallbladder wall thickening, pericholecystic fluid, or
positive sonographic Murphy's sign.

Common bile duct: Diameter: 6.7 mm. No intraluminal echoes are
observed.

Liver: The hepatic echotexture is increased diffusely. The surface
contour remains smooth. There is no focal mass or ductal dilation.
Portal vein is patent on color Doppler imaging with normal direction
of blood flow towards the liver.

IVC: No abnormality visualized.

Pancreas: Visualized portion unremarkable.

Spleen: Size and appearance within normal limits.

Right Kidney: Length: 11.9 cm. Echogenicity within normal limits. No
mass or hydronephrosis visualized.

Left Kidney: Length: 11.1 cm. Echogenicity within normal limits. No
mass or hydronephrosis visualized.

Abdominal aorta: Bowel gas obscures the distal abdominal aorta. The
proximal and midportions exhibit mural plaque but no evidence of an
aneurysm.

Other findings: There is no ascites.
IMPRESSION: Gallstones without sonographic evidence of acute cholecystitis.
Minimal dilation of the common bile duct without evidence of
intraluminal stones or sludge.

Increased hepatic echotexture most compatible with fatty
infiltrative change.

## 2019-08-18 ENCOUNTER — Other Ambulatory Visit: Payer: Self-pay | Admitting: Dermatology

## 2021-09-04 ENCOUNTER — Encounter (INDEPENDENT_AMBULATORY_CARE_PROVIDER_SITE_OTHER): Payer: Self-pay | Admitting: *Deleted

## 2022-07-04 ENCOUNTER — Encounter (INDEPENDENT_AMBULATORY_CARE_PROVIDER_SITE_OTHER): Payer: Self-pay

## 2022-08-16 ENCOUNTER — Telehealth (INDEPENDENT_AMBULATORY_CARE_PROVIDER_SITE_OTHER): Payer: Self-pay | Admitting: *Deleted

## 2022-08-16 NOTE — Telephone Encounter (Signed)
  Scheduler called patient on 05/08/22 & 05/16/22 and LVM and mailed letter on 07/04/22 asking patient to call and schedule TCS  Referring MD/PCP: Shearon Stalls  Procedure: tcs  Reason/Indication:  hx of polyps  Has patient had this procedure before?  Yes, 2017  If so, when, by whom and where?    Is there a family history of colon cancer?  no  Who?  What age when diagnosed?    Is patient diabetic? If yes, Type 1 or Type 2   no      Does patient have prosthetic heart valve or mechanical valve?  no  Do you have a pacemaker/defibrillator?  no  Has patient ever had endocarditis/atrial fibrillation? no  Does patient use oxygen? no  Has patient had joint replacement within last 12 months?  no  Is patient constipated or do they take laxatives? no  Does patient have a history of alcohol/drug use?  no  Have you had a stroke/heart attack last 6 mths? no  Do you take medicine for weight loss?  no  For female patients,: have you had a hysterectomy no                      are you post menopausal no                      do you still have your menstrual cycle no  Is patient on blood thinner such as Coumadin, Plavix and/or Aspirin? no  Medications: protonix 20 mg daily, MVI daily, glucosamine 1500 mg 1 bid, pravastatin 40 mg daily, atenolol 50 mg daily, famotidine 20 mg daily, gabapentin 300 mg tid, montelukast 10 mg daily, Co Q 10 300 mg daily, naproxen 220 bid prn  Allergies: nkda  Pharmacy: [facility]  Medication Adjustment per Dr Rehman/Dr Jenetta Downer   Procedure date & time:
